# Patient Record
Sex: Male | Born: 1985 | Race: White | Hispanic: No | Marital: Married | Smoking: Never smoker
Health system: Southern US, Community
[De-identification: ages and names within clinical notes are randomized; demographics above are authoritative.]

---
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
## 2008-12-18 ENCOUNTER — Emergency Department (HOSPITAL_COMMUNITY): Admission: EM | Admit: 2008-12-18 | Discharge: 2008-12-18 | Payer: Self-pay | Admitting: Family Medicine

## 2011-10-01 LAB — POCT RAPID STREP A: Streptococcus, Group A Screen (Direct): NEGATIVE

## 2016-11-18 ENCOUNTER — Encounter: Payer: Self-pay | Admitting: *Deleted

## 2016-11-18 ENCOUNTER — Other Ambulatory Visit: Payer: Self-pay

## 2016-11-18 DIAGNOSIS — R0789 Other chest pain: Secondary | ICD-10-CM | POA: Insufficient documentation

## 2016-11-18 LAB — BASIC METABOLIC PANEL
Anion gap: 6 (ref 5–15)
BUN: 14 mg/dL (ref 6–20)
CO2: 30 mmol/L (ref 22–32)
Calcium: 9.8 mg/dL (ref 8.9–10.3)
Chloride: 104 mmol/L (ref 101–111)
Creatinine, Ser: 1.04 mg/dL (ref 0.61–1.24)
GFR calc Af Amer: >60 mL/min (ref >60)
GFR calc non Af Amer: >60 mL/min (ref >60)
Glucose, Bld: 99 mg/dL (ref 65–99)
Potassium: 4.4 mmol/L (ref 3.5–5.1)
Sodium: 140 mmol/L (ref 135–145)

## 2016-11-18 LAB — CBC
HCT: 45.1 % (ref 40.0–52.0)
HEMOGLOBIN: 15.1 g/dL (ref 13.0–18.0)
MCH: 27.8 pg (ref 26.0–34.0)
MCHC: 33.5 g/dL (ref 32.0–36.0)
MCV: 83.1 fL (ref 80.0–100.0)
Platelets: 265 10*3/uL (ref 150–440)
RBC: 5.43 MIL/uL (ref 4.40–5.90)
RDW: 13.2 % (ref 11.5–14.5)
WBC: 10 10*3/uL (ref 3.8–10.6)

## 2016-11-18 LAB — TROPONIN I: Troponin I: <0.03 ng/mL (ref <0.03)

## 2016-11-18 NOTE — ED Triage Notes (Signed)
Pt ambulatory to triage. Pt states he has chest tightness since 1700 today.  No n/v/  nonradiating pain.  Pt alert.

## 2016-11-19 ENCOUNTER — Emergency Department: Payer: Managed Care, Other (non HMO)

## 2016-11-19 ENCOUNTER — Emergency Department
Admission: EM | Admit: 2016-11-19 | Discharge: 2016-11-19 | Disposition: A | Discharge status: Home / Self Care | Payer: Managed Care, Other (non HMO) | Attending: Emergency Medicine | Admitting: Emergency Medicine

## 2016-11-19 DIAGNOSIS — R079 Chest pain, unspecified: Secondary | ICD-10-CM

## 2016-11-19 LAB — TROPONIN I: Troponin I: <0.03 ng/mL (ref <0.03)

## 2016-11-19 LAB — FIBRIN DERIVATIVES D-DIMER (ARMC ONLY): Fibrin derivatives D-dimer (ARMC): 191 (ref 0–499)

## 2016-11-19 MED ORDER — GI COCKTAIL ~~LOC~~
30.0000 mL | Freq: Once | ORAL | Status: AC
  Administered 2016-11-19: 30 mL via ORAL

## 2016-11-19 MED ORDER — GI COCKTAIL ~~LOC~~
ORAL | Status: AC
  Administered 2016-11-19: 30 mL via ORAL
  Filled 2016-11-19: qty 30

## 2016-11-19 NOTE — ED Provider Notes (Signed)
[facility] Emergency Department Provider Note    First MD Initiated Contact with Patient 11/19/16 0005     (approximate)  I have reviewed the triage vital signs and the nursing notes.   HISTORY  Chief Complaint Chest Pain   HPI William Cox is a 30 y.o. male with no past medical history presents with 1 out of 10 nonradiating central chest tightness since 5 PM yesterday afternoon. Patient denies any nausea no vomiting no dyspnea. Patient denies any lower extremity pain swelling. Patient denies any family history of cardiac disease.   Past medical history  No past medical history There are no active problems to display for this patient.   Past surgical history None  Prior to Admission medications   Not on File    Allergies No known drug allergies No family history on file.  Social History Social History  Substance Use Topics  . Smoking status: Never Smoker  . Smokeless tobacco: Never Used  . Alcohol use No    Review of Systems Constitutional: No fever/chills Eyes: No visual changes. ENT: No sore throat. Cardiovascular: Positive for  chest pain. Respiratory: Denies shortness of breath. Gastrointestinal: No abdominal pain.  No nausea, no vomiting.  No diarrhea.  No constipation. Genitourinary: Negative for dysuria. Musculoskeletal: Negative for back pain. Skin: Negative for rash. Neurological: Negative for headaches, focal weakness or numbness.  10-point ROS otherwise negative.  ____________________________________________   PHYSICAL EXAM:  VITAL SIGNS: ED Triage Vitals  Enc Vitals Group     BP --      Pulse Rate 11/18/16 2212 94     Resp 11/18/16 2212 20     Temp 11/18/16 2212 98.1 F (36.7 C)     Temp Source 11/18/16 2212 Oral     SpO2 11/18/16 2212 100 %     Weight 11/18/16 2213 205 lb (93 kg)     Height 11/18/16 2213 5\' 11"  (1.803 m)     Head Circumference --      Peak Flow --      Pain Score 11/18/16 2213 2       Pain Loc --      Pain Edu? --      Excl. in GC? --     Constitutional: Alert and oriented. Well appearing and in no acute distress. Eyes: Conjunctivae are normal. PERRL. EOMI. Head: Atraumatic. Mouth/Throat: Mucous membranes are moist.  Oropharynx non-erythematous. Neck: No stridor.  Cardiovascular: Normal rate, regular rhythm. Good peripheral circulation. Grossly normal heart sounds. Respiratory: Normal respiratory effort.  No retractions. Lungs CTAB. Gastrointestinal: Soft and nontender. No distention.  Musculoskeletal: No lower extremity tenderness nor edema. No gross deformities of extremities. Neurologic:  Normal speech and language. No gross focal neurologic deficits are appreciated.  Skin:  Skin is warm, dry and intact. No rash noted. Psychiatric: Mood and affect are normal. Speech and behavior are normal.  ____________________________________________   LABS (all labs ordered are listed, but only abnormal results are displayed)  Labs Reviewed  BASIC METABOLIC PANEL  CBC  TROPONIN I  TROPONIN I  FIBRIN DERIVATIVES D-DIMER ([facility] ONLY)   ____________________________________________  EKG  ED ECG REPORT I, Edison N BROWN, the attending physician, personally viewed and interpreted this ECG.   Date: 11/19/2016  EKG Time: 10:20 PM  Rate: 91  Rhythm: Normal sinus rhythm  Axis: Normal  Intervals: Normal  ST&T Change: None  ____________________________________________  RADIOLOGY I, Alpha N BROWN, personally viewed and evaluated these images (plain radiographs) as part  of my medical decision making, as well as reviewing the written report by the radiologist.  Dg Chest 2 View  Result Date: 11/19/2016 CLINICAL DATA:  Chest tightness in 1700 hours today. EXAM: CHEST  2 VIEW COMPARISON:  None. FINDINGS: Normal heart size and pulmonary vascularity. No focal airspace disease or consolidation in the lungs. No blunting of costophrenic angles. No pneumothorax.  Mediastinal contours appear intact. Mild thoracic scoliosis convex towards the left. IMPRESSION: No active cardiopulmonary disease. Electronically Signed   By: Burman NievesWilliam  Stevens M.D.   On: 11/19/2016 01:21      Procedures      INITIAL IMPRESSION / ASSESSMENT AND PLAN / ED COURSE  Pertinent labs & imaging results that were available during my care of the patient were reviewed by me and considered in my medical decision making (see chart for details).  No clear etiology for the patient's chest pain identified EKG was unremarkable. Troponin negative 2 d-dimer likewise. Chest x-ray negative. Will refer the patient to cardiologist   Clinical Course     ____________________________________________  FINAL CLINICAL IMPRESSION(S) / ED DIAGNOSES  Final diagnoses:  Chest pain, unspecified type     MEDICATIONS GIVEN DURING THIS VISIT:  Medications  gi cocktail (Maalox,Lidocaine,Donnatal) (30 mLs Oral Given 11/19/16 0157)     NEW OUTPATIENT MEDICATIONS STARTED DURING THIS VISIT:  New Prescriptions   No medications on file    Modified Medications   No medications on file    Discontinued Medications   No medications on file     Note:  This document was prepared using Dragon voice recognition software and may include unintentional dictation errors.    Darci Currentandolph N Brown, MD 11/19/16 [phone]

## 2016-11-19 NOTE — ED Notes (Signed)
ED Provider at bedside. 

## 2018-04-26 IMAGING — CR DG CHEST 2V
2 series · 2 of 2 positions shown · non-contrast
Comparison: None.

CLINICAL DATA: Chest tightness in 4344 hours today.

EXAM:
CHEST  2 VIEW

[chest lat]
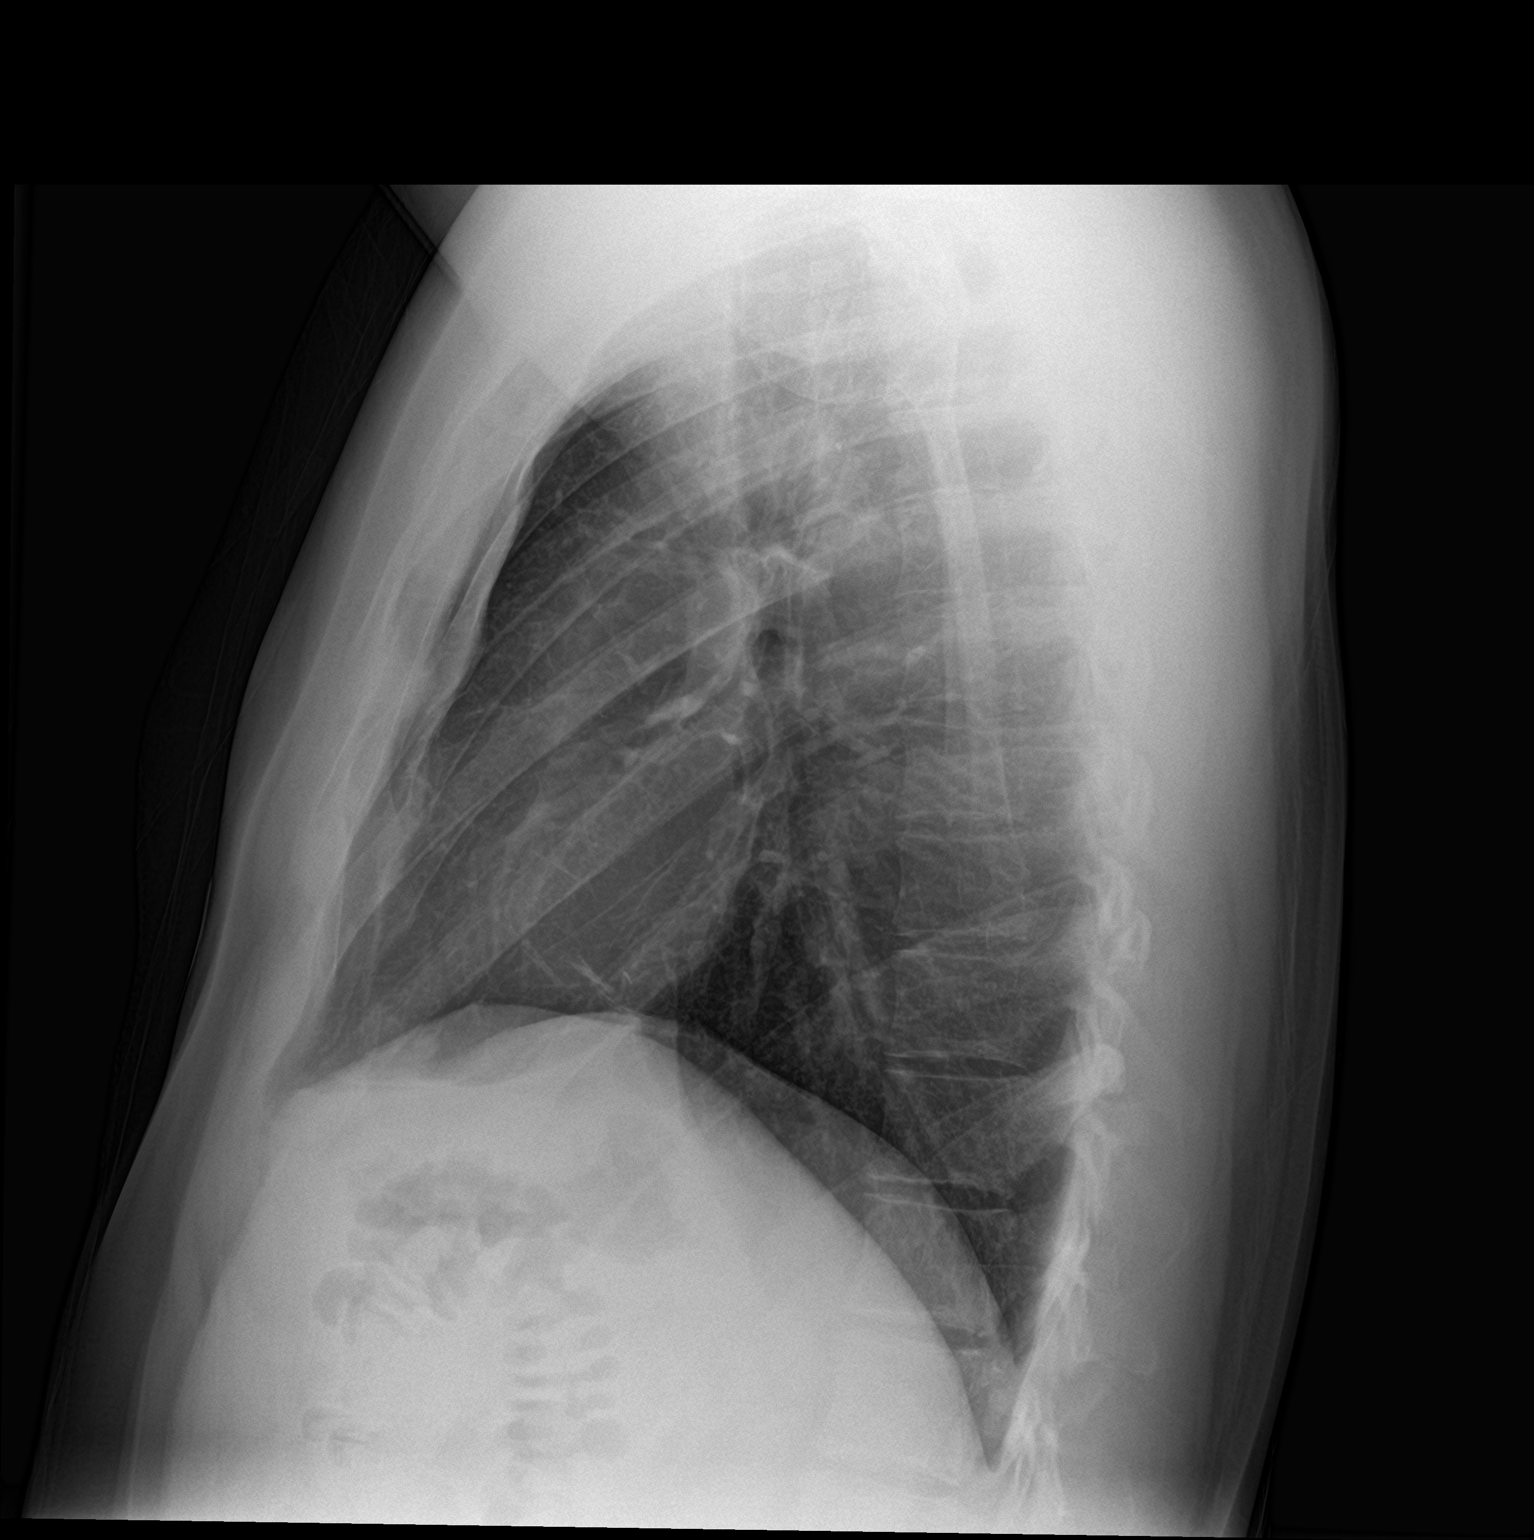

[chest pa]
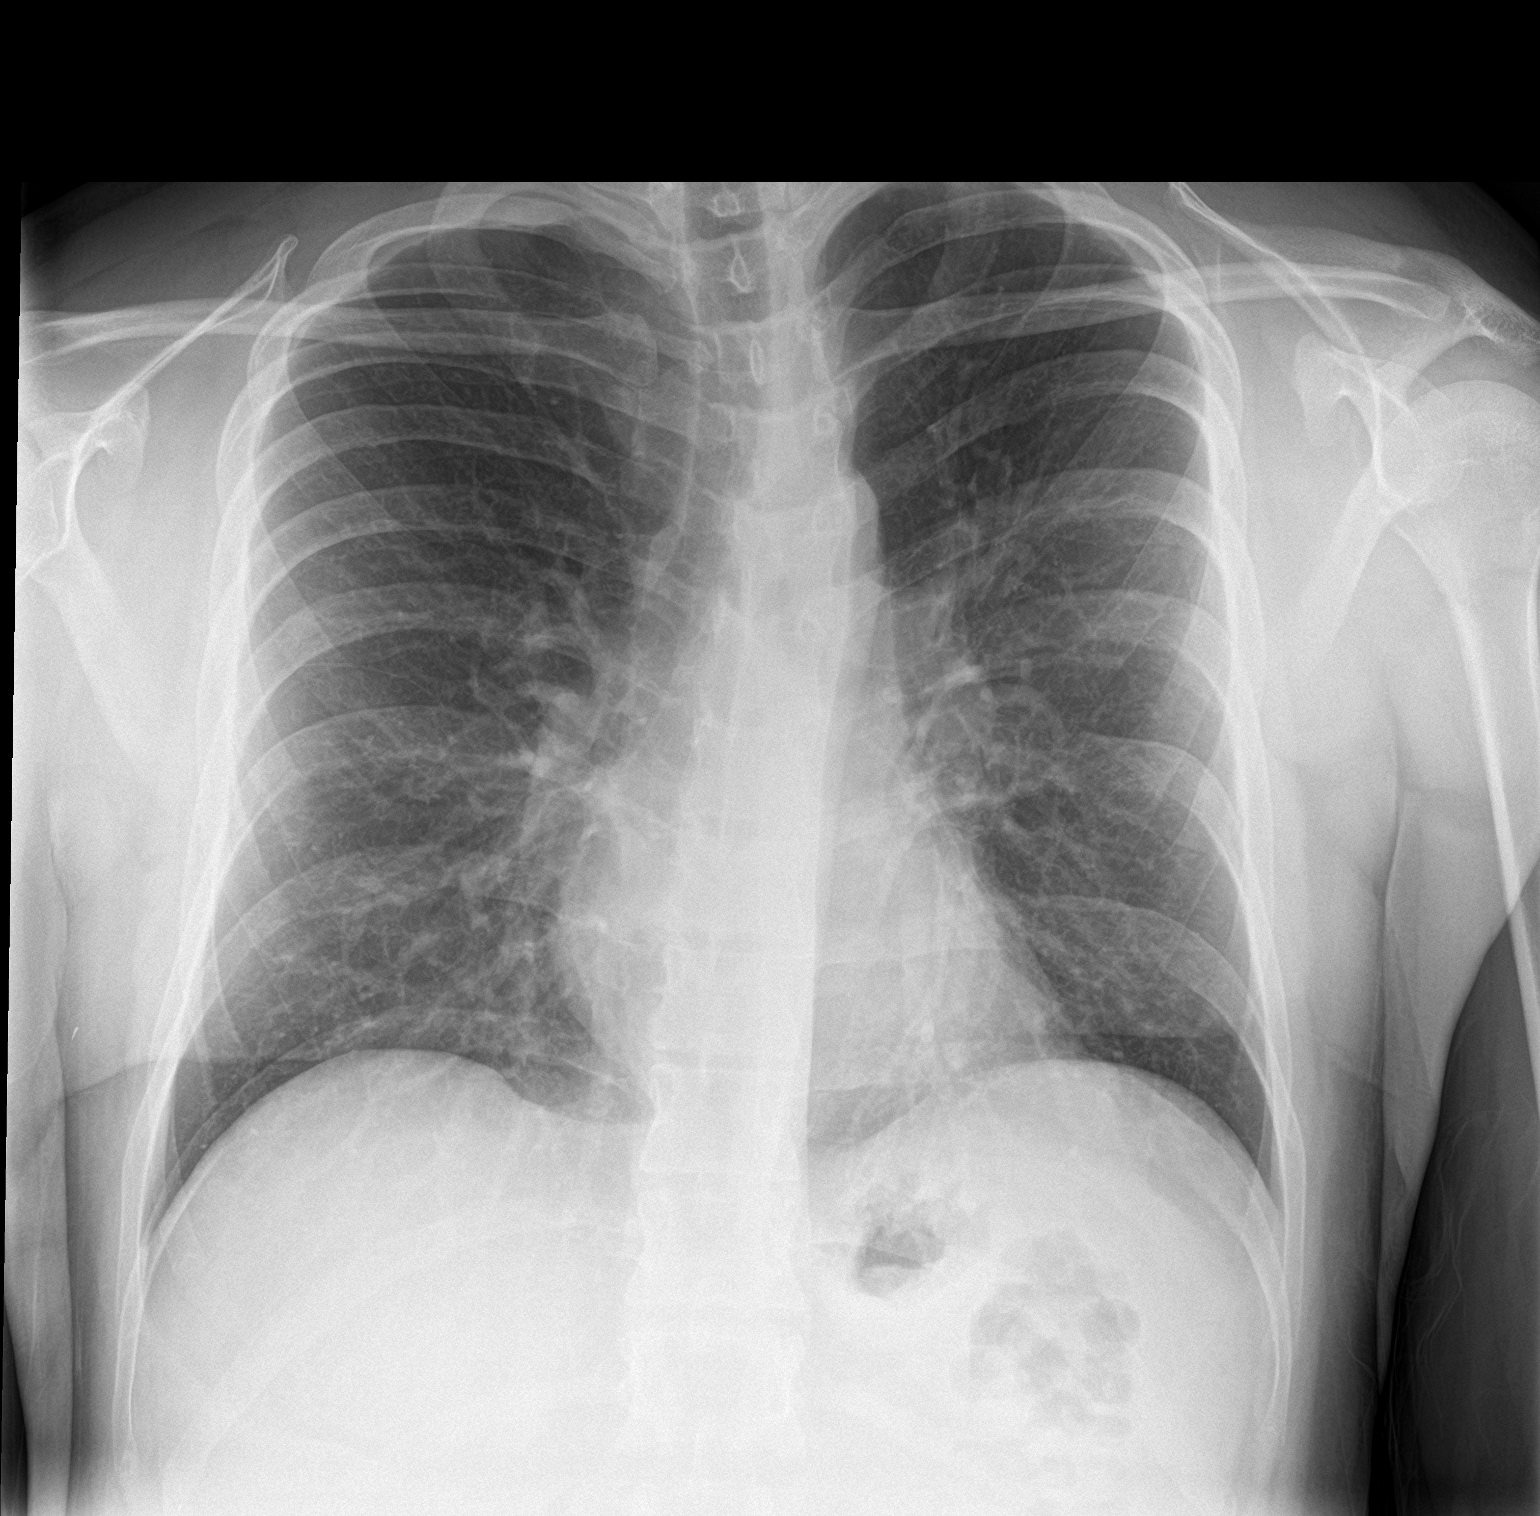

[2 of 2 positions shown; findings below may reference images not displayed]

FINDINGS: Normal heart size and pulmonary vascularity. No focal airspace
disease or consolidation in the lungs. No blunting of costophrenic
angles. No pneumothorax. Mediastinal contours appear intact. Mild
thoracic scoliosis convex towards the left.
IMPRESSION: No active cardiopulmonary disease.
